# Patient Record
Sex: Male | Born: 1955 | Race: White | Hispanic: No | Marital: Married | Smoking: Never smoker
Health system: Southern US, Community
[De-identification: ages and names within clinical notes are randomized; demographics above are authoritative.]

## PROBLEM LIST (undated history)

## (undated) DIAGNOSIS — I10 Essential (primary) hypertension: Secondary | ICD-10-CM

## (undated) DIAGNOSIS — I219 Acute myocardial infarction, unspecified: Secondary | ICD-10-CM

## (undated) DIAGNOSIS — E785 Hyperlipidemia, unspecified: Secondary | ICD-10-CM

## (undated) HISTORY — PX: KNEE SURGERY: SHX244

## (undated) HISTORY — PX: TESTICLE BIOPSY: SHX471

## (undated) HISTORY — PX: HERNIA REPAIR: SHX51

## (undated) HISTORY — PX: CARDIAC SURGERY: SHX584

---

## 2018-05-03 ENCOUNTER — Encounter: Payer: Self-pay | Admitting: Gynecology

## 2018-05-03 ENCOUNTER — Ambulatory Visit (INDEPENDENT_AMBULATORY_CARE_PROVIDER_SITE_OTHER): Payer: BLUE CROSS/BLUE SHIELD

## 2018-05-03 ENCOUNTER — Ambulatory Visit
Admission: EM | Admit: 2018-05-03 | Discharge: 2018-05-03 | Disposition: A | Discharge status: Home / Self Care | Payer: BLUE CROSS/BLUE SHIELD | Attending: Internal Medicine | Admitting: Internal Medicine

## 2018-05-03 ENCOUNTER — Other Ambulatory Visit: Payer: Self-pay

## 2018-05-03 DIAGNOSIS — S62524A Nondisplaced fracture of distal phalanx of right thumb, initial encounter for closed fracture: Secondary | ICD-10-CM | POA: Diagnosis not present

## 2018-05-03 DIAGNOSIS — M79641 Pain in right hand: Secondary | ICD-10-CM | POA: Diagnosis not present

## 2018-05-03 HISTORY — DX: Essential (primary) hypertension: I10

## 2018-05-03 HISTORY — DX: Hyperlipidemia, unspecified: E78.5

## 2018-05-03 HISTORY — DX: Acute myocardial infarction, unspecified: I21.9

## 2018-05-03 MED ORDER — DOXYCYCLINE HYCLATE 100 MG PO CAPS: 100.0000 mg | ORAL_CAPSULE | Freq: Two times a day (BID) | ORAL | 0 refills | Status: AC

## 2018-05-03 NOTE — ED Triage Notes (Signed)
Per patient jammed his right thumb into a steel gate x couple days.

## 2018-05-03 NOTE — ED Provider Notes (Signed)
[facility]    CSN: [id number] Arrival date & time: 05/03/18  4098     History   Chief Complaint Chief Complaint  Patient presents with  . Finger Injury    HPI Luis Hubbard is a 62 y.o. male.   HPI  62 year old male presents with a painful right dominant thumb fingertip.  States that he jammed his right thumb on a steel gate a couple of days ago.  Since that time he has been very active throwing his 59 yo daughter up in the air in a pool and spending several hours on a 0 turn lawnmower.  Last night the pain got more severe with throbbing and he stayed up most the night because the pain.  He has no obvious injury to the nail but he has swelling and tenderness of the distal pad and base of the nail; there is no evidence of a subungual hematoma.        Past Medical History:  Diagnosis Date  . Hyperlipemia   . Hypertension   . MI (myocardial infarction) (HCC)     There are no active problems to display for this patient.   Past Surgical History:  Procedure Laterality Date  . CARDIAC SURGERY    . HERNIA REPAIR    . KNEE SURGERY    . TESTICLE BIOPSY         Home Medications    Prior to Admission medications   Medication Sig Start Date End Date Taking? Authorizing Provider  aspirin 81 MG chewable tablet Chew by mouth. 06/08/15  Yes [provider]  loratadine (CLARITIN) 10 MG tablet Take by mouth.   Yes [provider]  TURMERIC PO Take by mouth.   Yes [provider]  doxycycline (VIBRAMYCIN) 100 MG capsule Take 1 capsule (100 mg total) by mouth 2 (two) times daily. 05/03/18   Lutricia Feil, PA-C    Family History History reviewed. No pertinent family history.  Social History Social History   Tobacco Use  . Smoking status: Never Smoker  . Smokeless tobacco: Never Used  Substance Use Topics  . Alcohol use: Yes  . Drug use: Never     Allergies   Statins   Review of Systems Review of Systems    Constitutional: Positive for activity change. Negative for chills, fatigue and fever.  All other systems reviewed and are negative.    Physical Exam Triage Vital Signs ED Triage Vitals  Enc Vitals Group     BP 05/03/18 0850 127/81     Pulse Rate 05/03/18 0850 63     Resp 05/03/18 0850 16     Temp 05/03/18 0850 98.2 F (36.8 C)     Temp Source 05/03/18 0850 Oral     SpO2 05/03/18 0850 100 %     Weight 05/03/18 0851 200 lb (90.7 kg)     Height 05/03/18 0851 5\' 11"  (1.803 m)     Head Circumference --      Peak Flow --      Pain Score --      Pain Loc --      Pain Edu? --      Excl. in GC? --    No data found.  Updated Vital Signs BP 127/81 (BP Location: Left Arm)   Pulse 63   Temp 98.2 F (36.8 C) (Oral)   Resp 16   Ht 5\' 11"  (1.803 m)   Wt 200 lb (90.7 kg)   SpO2 100%  BMI 27.89 kg/m   Visual Acuity Right Eye Distance:   Left Eye Distance:   Bilateral Distance:    Right Eye Near:   Left Eye Near:    Bilateral Near:     Physical Exam  Constitutional: He is oriented to person, place, and time. He appears well-developed and well-nourished. No distress.  HENT:  Head: Normocephalic.  Eyes: Pupils are equal, round, and reactive to light. Right eye exhibits no discharge. Left eye exhibits no discharge.  Neck: Normal range of motion.  Musculoskeletal: Normal range of motion. He exhibits edema and tenderness.  Exam of the right thumb shows swelling of the distal pulp area there does not appear to be any significant injury to the nail.  There are no breaks in the skin.  There no subungual hematoma present.  It is tender over the proximal nail near the matrix radial aspect.  Is also tender of the distal pad proximally near the IP joint also radially.  The pad is full but no specific fluctuance or pointing abscess is appreciated.  Is no tenderness of the proximal phalanx or of the MP joint.  Remainder of the hand exam appears normal.  The distal pulp is not firm or appear  indurated.  Neurological: He is alert and oriented to person, place, and time.  Skin: Skin is warm and dry. He is not diaphoretic.  Psychiatric: He has a normal mood and affect. His behavior is normal. Judgment and thought content normal.  Nursing note and vitals reviewed.    UC Treatments / Results  Labs (all labs ordered are listed, but only abnormal results are displayed) Labs Reviewed - No data to display  EKG None  Radiology Dg Finger Thumb Right  Result Date: 05/03/2018 CLINICAL DATA:  Initial encounter for Per patient jammed his right thumb into a steel gate x couple days. Complete thumb swelling and pain 1 EXAM: RIGHT THUMB 2+V COMPARISON:  None. FINDINGS: Degenerate changes about the interphalangeal joint of the thumb. These are moderate, including endplate osteophytes. Subtle lucency involving the proximal portion of the distal phalanx of the thumb is favored to be degenerative. Not localized on the first image. Suggestion of soft tissue swelling about the interphalangeal joint. 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 Lucency through the proximal portion of the distal phalanx is favored to be degenerative. Nondisplaced fracture is felt less likely. Electronically Signed   By: Jeronimo GreavesKyle  Talbot M.D.   On: 05/03/2018 09:51    Procedures Procedures (including critical care time)  Medications Ordered in UC Medications - No data to display  Initial Impression / Assessment and Plan / UC Course  I have reviewed the triage vital signs and the nursing notes.  Pertinent labs & imaging results that were available during my care of the patient were reviewed by me and considered in my medical decision making (see chart for details).     Plan: 1. Test/x-ray results and diagnosis reviewed with patient 2. rx as per orders; risks, benefits, potential side effects reviewed with patient 3. Recommend supportive treatment with elevation and ice.   Patient was given a stack splint for protection.  I suspect that his swelling of the thumb and the pain is from a fracture of the proximal portion of the distal phalanx radial aspect.  Do not feel that this is a felon with the injury he incurred and physical findings today.  However I did give him a prescription for doxycycline since he was going  to check with a hand surgeon friend; if instructed by the hand surgeon will start the antibiotic. 4. F/u prn if symptoms worsen or don't improve  Final Clinical Impressions(s) / UC Diagnoses   Final diagnoses:  Closed nondisplaced fracture of distal phalanx of right thumb, initial encounter     Discharge Instructions     Elevate above your heart control swelling and pain.  Use ibuprofen 800 mg 3 times a day with food for pain.    ED Prescriptions    Medication Sig Dispense Auth. Provider   doxycycline (VIBRAMYCIN) 100 MG capsule Take 1 capsule (100 mg total) by mouth 2 (two) times daily. 10 capsule Lutricia Feiloemer, Kayo P, PA-C     Controlled Substance Prescriptions [place] Controlled Substance Registry consulted? Not Applicable   Lutricia FeilRoemer, Demarie P, PA-C 05/03/18 1645

## 2018-05-03 NOTE — Discharge Instructions (Signed)
Elevate above your heart control swelling and pain.  Use ibuprofen 800 mg 3 times a day with food for pain.

## 2019-09-12 IMAGING — CR DG FINGER THUMB 2+V*R*
3 series · 3 of 3 positions shown · non-contrast
Comparison: None.

CLINICAL DATA: Initial encounter for Per patient jammed his right
thumb into a steel gate x couple days. Complete thumb swelling and
pain 1

EXAM:
RIGHT THUMB 2+V

[finger ap]
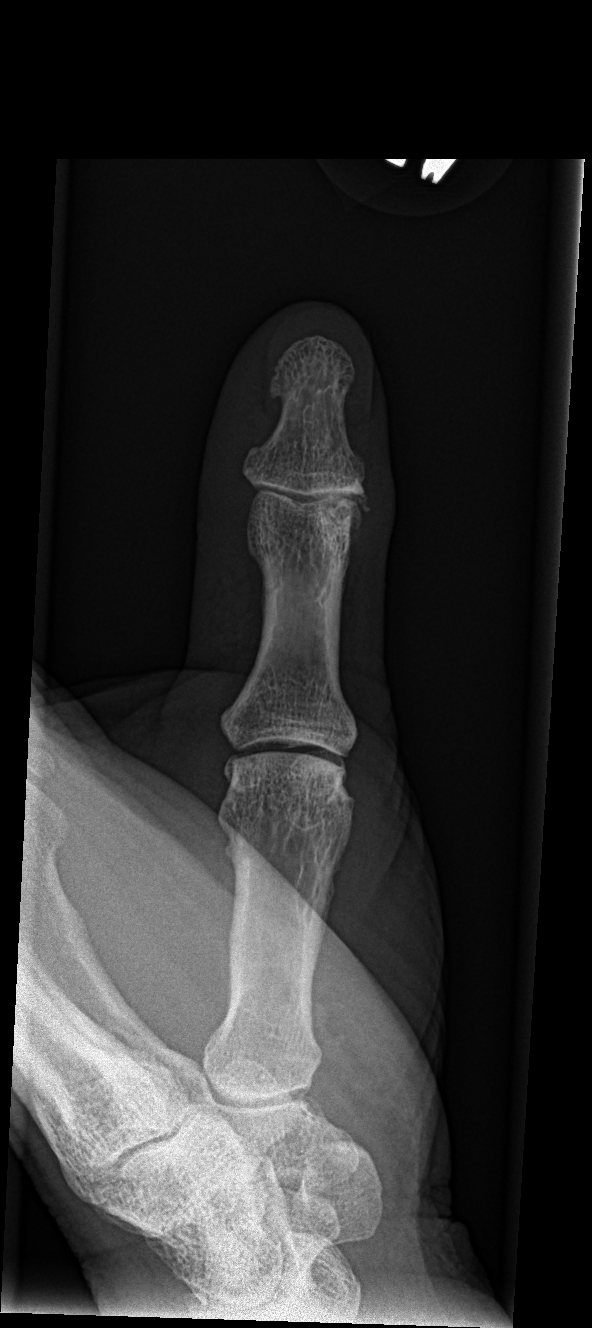

[finger obl]
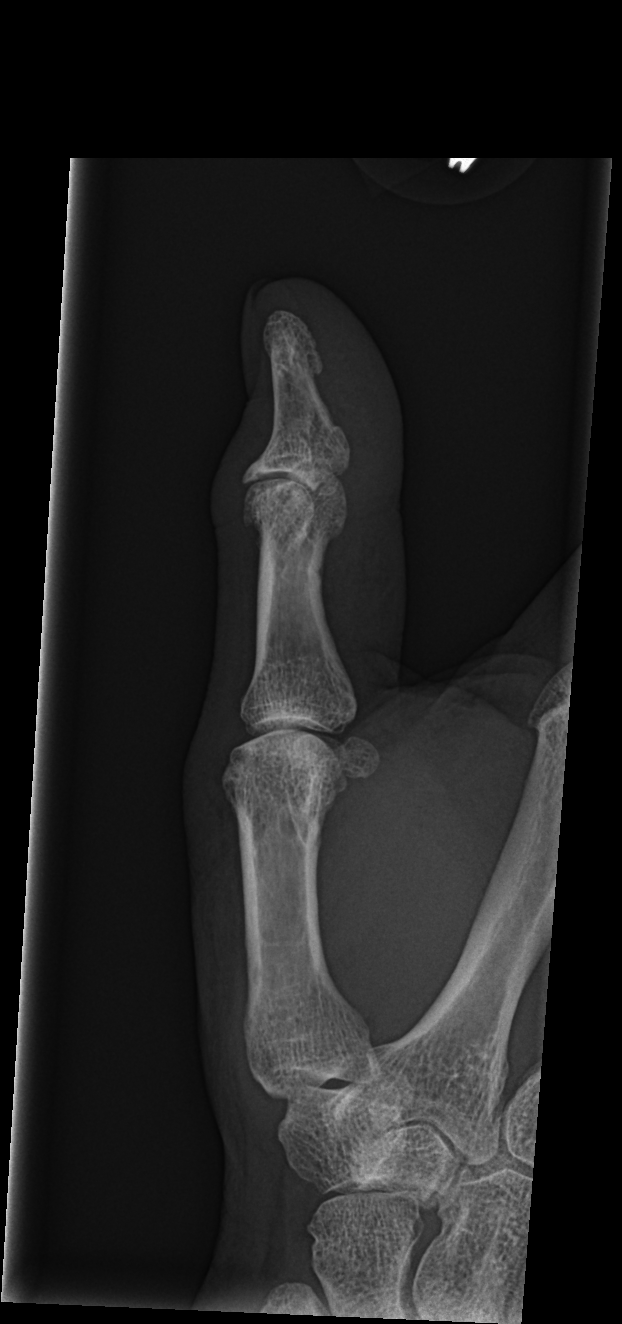

[finger lat]
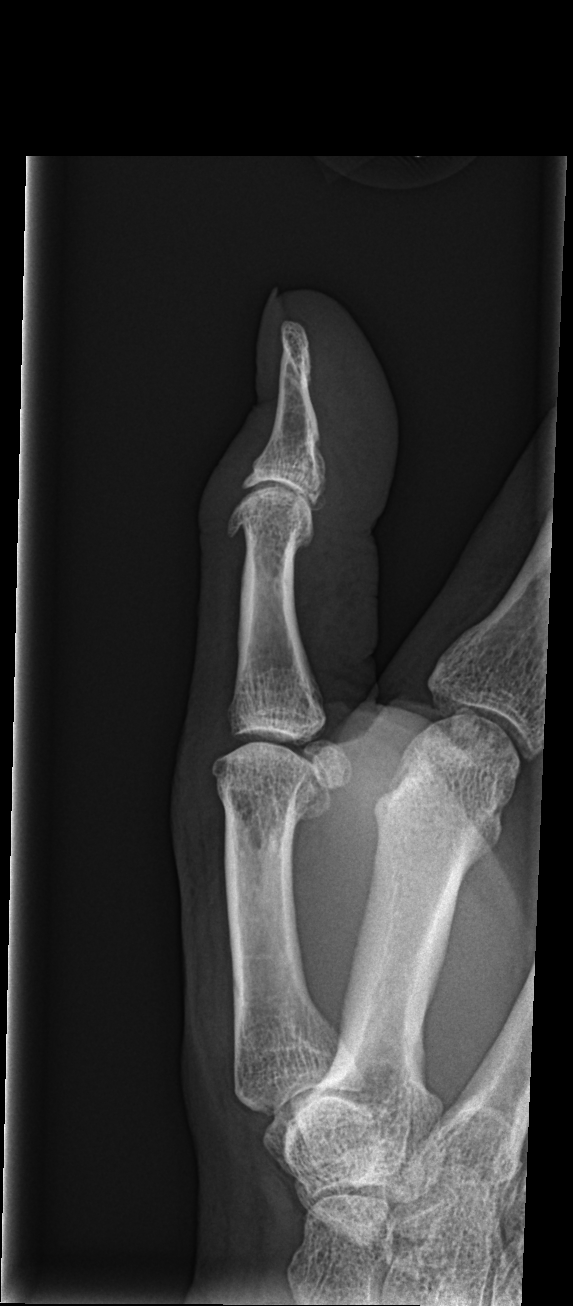

[3 of 3 positions shown; findings below may reference images not displayed]

FINDINGS: Degenerate changes about the interphalangeal joint of the thumb.
These are moderate, including endplate osteophytes. Subtle lucency
involving the proximal portion of the distal phalanx of the thumb is
favored to be degenerative. Not localized on the first image.
Suggestion of soft tissue swelling about the interphalangeal joint.
IMPRESSION: Moderate degenerate changes about the interphalangeal joint of the
thumb. Lucency through the proximal portion of the distal phalanx is
favored to be degenerative. Nondisplaced fracture is felt less
likely.
